# Patient Record
Sex: Male | Born: 1940 | Race: White | Hispanic: No | Marital: Single | Smoking: Never smoker
Health system: Southern US, Community
[De-identification: ages and names within clinical notes are randomized; demographics above are authoritative.]

## PROBLEM LIST (undated history)

## (undated) DIAGNOSIS — R194 Change in bowel habit: Secondary | ICD-10-CM

## (undated) DIAGNOSIS — R1310 Dysphagia, unspecified: Secondary | ICD-10-CM

## (undated) DIAGNOSIS — N200 Calculus of kidney: Secondary | ICD-10-CM

## (undated) DIAGNOSIS — K5900 Constipation, unspecified: Secondary | ICD-10-CM

## (undated) DIAGNOSIS — N189 Chronic kidney disease, unspecified: Secondary | ICD-10-CM

## (undated) DIAGNOSIS — C801 Malignant (primary) neoplasm, unspecified: Secondary | ICD-10-CM

## (undated) DIAGNOSIS — R634 Abnormal weight loss: Secondary | ICD-10-CM

## (undated) DIAGNOSIS — F4320 Adjustment disorder, unspecified: Secondary | ICD-10-CM

## (undated) DIAGNOSIS — R202 Paresthesia of skin: Secondary | ICD-10-CM

## (undated) HISTORY — DX: Calculus of kidney: N20.0

## (undated) HISTORY — PX: COLONOSCOPY: SHX174

## (undated) HISTORY — DX: Chronic kidney disease, unspecified: N18.9

## (undated) HISTORY — PX: TONSILLECTOMY: SUR1361

## (undated) HISTORY — PX: OTHER SURGICAL HISTORY: SHX169

---

## 2005-01-01 ENCOUNTER — Ambulatory Visit: Payer: Self-pay | Admitting: Unknown Physician Specialty

## 2007-01-06 ENCOUNTER — Ambulatory Visit: Payer: Self-pay | Admitting: Internal Medicine

## 2016-05-03 ENCOUNTER — Other Ambulatory Visit: Payer: Self-pay | Admitting: Internal Medicine

## 2016-05-03 DIAGNOSIS — K5900 Constipation, unspecified: Secondary | ICD-10-CM

## 2016-05-03 DIAGNOSIS — R634 Abnormal weight loss: Secondary | ICD-10-CM

## 2016-05-10 ENCOUNTER — Ambulatory Visit
Admission: RE | Admit: 2016-05-10 | Discharge: 2016-05-10 | Disposition: A | Discharge status: Home / Self Care | Payer: Medicare Other | Source: Ambulatory Visit | Attending: Internal Medicine | Admitting: Internal Medicine

## 2016-05-10 DIAGNOSIS — N400 Enlarged prostate without lower urinary tract symptoms: Secondary | ICD-10-CM | POA: Diagnosis not present

## 2016-05-10 DIAGNOSIS — K5900 Constipation, unspecified: Secondary | ICD-10-CM

## 2016-05-10 DIAGNOSIS — K769 Liver disease, unspecified: Secondary | ICD-10-CM | POA: Insufficient documentation

## 2016-05-10 DIAGNOSIS — R933 Abnormal findings on diagnostic imaging of other parts of digestive tract: Secondary | ICD-10-CM | POA: Diagnosis not present

## 2016-05-10 DIAGNOSIS — N289 Disorder of kidney and ureter, unspecified: Secondary | ICD-10-CM | POA: Diagnosis not present

## 2016-05-10 DIAGNOSIS — N132 Hydronephrosis with renal and ureteral calculous obstruction: Secondary | ICD-10-CM | POA: Insufficient documentation

## 2016-05-10 DIAGNOSIS — K5909 Other constipation: Secondary | ICD-10-CM | POA: Diagnosis present

## 2016-05-10 DIAGNOSIS — R634 Abnormal weight loss: Secondary | ICD-10-CM

## 2016-07-10 ENCOUNTER — Emergency Department
Admission: EM | Admit: 2016-07-10 | Discharge: 2016-07-10 | Disposition: A | Discharge status: Home / Self Care | Payer: Medicare Other | Attending: Emergency Medicine | Admitting: Emergency Medicine

## 2016-07-10 ENCOUNTER — Emergency Department: Payer: Medicare Other

## 2016-07-10 DIAGNOSIS — R1084 Generalized abdominal pain: Secondary | ICD-10-CM | POA: Diagnosis present

## 2016-07-10 DIAGNOSIS — K5900 Constipation, unspecified: Secondary | ICD-10-CM | POA: Insufficient documentation

## 2016-07-10 DIAGNOSIS — Z85828 Personal history of other malignant neoplasm of skin: Secondary | ICD-10-CM | POA: Diagnosis not present

## 2016-07-10 MED ORDER — POLYETHYLENE GLYCOL 3350 17 GM/SCOOP PO POWD: ORAL | 0 refills | Status: AC

## 2016-07-10 MED ORDER — MAGNESIUM CITRATE PO SOLN
ORAL | Status: AC
  Administered 2016-07-10: 1 via ORAL
  Filled 2016-07-10: qty 296

## 2016-07-10 MED ORDER — MAGNESIUM CITRATE PO SOLN
1.0000 | Freq: Once | ORAL | Status: AC
  Administered 2016-07-10: 1 via ORAL

## 2016-07-10 MED ORDER — SENNA 8.6 MG PO TABS: 2.0000 | ORAL_TABLET | Freq: Two times a day (BID) | ORAL | 0 refills | Status: AC

## 2016-07-10 MED ORDER — DOCUSATE SODIUM 100 MG PO CAPS
200.0000 mg | ORAL_CAPSULE | Freq: Two times a day (BID) | ORAL | 0 refills | Status: AC
Start: 2016-07-10 — End: ?

## 2016-07-10 NOTE — ED Provider Notes (Signed)
[facility] Emergency Department Provider Note  ____________________________________________  Time seen: Approximately 10:19 PM  I have reviewed the triage vital signs and the nursing notes.   HISTORY  Chief Complaint Constipation    HPI Thomas Beasley is a 76 y.o. male who complains of chronic constipation. He's been having small bowel movements for many months. Denies nausea or vomiting. Eating normally. Has occasional abdominal fullness, and the passes some stool and feels better. He is passing flatus. Takes small doses of MiraLAX, half capful, to manage this without relief. No focal pain. No fever or chills. No dizziness.     No past medical history on file. Enlarged prostate  There are no active problems to display for this patient.    No past surgical history on file. Tonsillectomy  Prior to Admission medications   Medication Sig Start Date End Date Taking? Authorizing Provider  docusate sodium (COLACE) 100 MG capsule Take 2 capsules (200 mg total) by mouth 2 (two) times daily. 07/10/16   Carrie Mew, MD  polyethylene glycol powder [facility]) powder 2 cap fulls in a full glass of water, two times a day, for 5 days. 07/10/16   Carrie Mew, MD  senna (SENOKOT) 8.6 MG TABS tablet Take 2 tablets (17.2 mg total) by mouth 2 (two) times daily. 07/10/16   Carrie Mew, MD     Allergies Patient has no known allergies.   No family history on file.  Social History Social History  Substance Use Topics  . Smoking status: Not on file  . Smokeless tobacco: Not on file  . Alcohol use Not on file  No tobacco or alcohol use  Review of Systems  Constitutional:   No fever or chills.  ENT:   No sore throat. No rhinorrhea. Cardiovascular:   No chest pain. Respiratory:   No dyspnea or cough. Gastrointestinal:   Occasional abdominal fullness, no vomiting. Chronic constipation. Genitourinary:   Negative for dysuria or  difficulty urinating.  10-point ROS otherwise negative.  ____________________________________________   PHYSICAL EXAM:  VITAL SIGNS: ED Triage Vitals [07/10/16 1923]  Enc Vitals Group     BP 139/86     Pulse Rate 74     Resp 20     Temp 97.8 F (36.6 C)     Temp Source Oral     SpO2 97 %     Weight 113 lb (51.3 kg)     Height 5\' 9"  (1.753 m)     Head Circumference      Peak Flow      Pain Score      Pain Loc      Pain Edu?      Excl. in [place]?     Vital signs reviewed, nursing assessments reviewed.   Constitutional:   Alert and oriented. Well appearing and in no distress. Eyes:   No scleral icterus. No conjunctival pallor. PERRL. EOMI.  No nystagmus. ENT   Head:   Normocephalic and atraumatic.   Nose:   No congestion/rhinnorhea. No septal hematoma   Mouth/Throat:   MMM, no pharyngeal erythema. No peritonsillar mass.    Neck:   No stridor. No SubQ emphysema. No meningismus. Hematological/Lymphatic/Immunilogical:   No cervical lymphadenopathy. Cardiovascular:   RRR. Symmetric bilateral radial and DP pulses.  No murmurs.  Respiratory:   Normal respiratory effort without tachypnea nor retractions. Breath sounds are clear and equal bilaterally. No wheezes/rales/rhonchi. Gastrointestinal:   Soft and nontender. Non distended. There is no CVA tenderness.  No rebound,  rigidity, or guarding.No tympany. Rectal exam shows small amount of brown stool in the vault, no fecal impaction or other hard stool present. Genitourinary:   Normal Musculoskeletal:   Normal range of motion in all extremities. No joint effusions.  No lower extremity tenderness.  No edema. Neurologic:   Normal speech and language.  CN 2-10 normal. Motor grossly intact. No gross focal neurologic deficits are appreciated.  Skin:    Skin is warm, dry and intact. No rash noted.  No petechiae, purpura, or bullae.  ____________________________________________    LABS (pertinent positives/negatives) (all  labs ordered are listed, but only abnormal results are displayed) Labs Reviewed - No data to display ____________________________________________   EKG    ____________________________________________    RADIOLOGY  Dg Abdomen 1 View  Result Date: 07/10/2016 CLINICAL DATA:  Constipation, possible obstruction. EXAM: ABDOMEN - 1 VIEW COMPARISON:  CT 05/10/2016 FINDINGS: Air stool throughout the colon with moderate fecal retention over the left colon and rectosigmoid colon. Few nondilated air-filled small bowel loops are present. No evidence of free peritoneal air. Degenerative change of the spine. IMPRESSION: Nonobstructive bowel gas pattern mild fecal retention over the rectosigmoid colon and left colon. Electronically Signed   By: Marin Olp M.D.   On: 07/10/2016 20:11    ____________________________________________   PROCEDURES Procedures  ____________________________________________   INITIAL IMPRESSION / ASSESSMENT AND PLAN / ED COURSE  Pertinent labs & imaging results that were available during my care of the patient were reviewed by me and considered in my medical decision making (see chart for details).  Patient presents with symptoms of chronic constipation. Very well-appearing. Tolerating oral intake. X-ray consistent with constipation. No fecal impaction on rectal exam, no hard retained stool. Plan to discharge home, short course of high-dose MiraLAX, magnesium citrate. Labs from yesterday entirely normal. Follow-up with primary care.Considering the patient's symptoms, medical history, and physical examination today, I have low suspicion for cholecystitis or biliary pathology, pancreatitis, perforation or bowel obstruction, hernia, intra-abdominal abscess, AAA or dissection, volvulus or intussusception, mesenteric ischemia, or appendicitis.       Clinical Course as of Jul 10 2217  Tue Jul 10, 2016  2152 Xr nonobstructive. Sx not c/w SBO/volvulus. Rectal negative.  Mag citrate, f/u pcp  [PS]    Clinical Course User Index [PS] Carrie Mew, MD     ____________________________________________   FINAL CLINICAL IMPRESSION(S) / ED DIAGNOSES  Final diagnoses:  Constipation, unspecified constipation type  Generalized abdominal pain      New Prescriptions   DOCUSATE SODIUM (COLACE) 100 MG CAPSULE    Take 2 capsules (200 mg total) by mouth 2 (two) times daily.   POLYETHYLENE GLYCOL POWDER (GLYCOLAX/MIRALAX) POWDER    2 cap fulls in a full glass of water, two times a day, for 5 days.   SENNA (SENOKOT) 8.6 MG TABS TABLET    Take 2 tablets (17.2 mg total) by mouth 2 (two) times daily.     Portions of this note were generated with dragon dictation software. Dictation errors may occur despite best attempts at proofreading.    Carrie Mew, MD 07/10/16 2225

## 2016-07-10 NOTE — ED Notes (Signed)
Pt states long hx w/constipation impactions. States daily oral meds, and if no BM, also uses a suppository and sometimes the only product is gas and mucous. Concerns for obstruction today d/t decreased urination as well.

## 2016-07-10 NOTE — ED Triage Notes (Signed)
Patient reports saw GI doctors to set up a colonoscopy and he informed them of long history of constipation/obstruction.  They instructed him to take 2 caps full of Miralax last night and again around noon today.  Patient had no results.  Patient was instructed to come ED for possible obstruction.

## 2016-07-10 NOTE — ED Notes (Signed)

## 2016-07-13 ENCOUNTER — Encounter: Payer: Self-pay | Admitting: *Deleted

## 2016-07-16 ENCOUNTER — Ambulatory Visit
Admission: RE | Admit: 2016-07-16 | Discharge: 2016-07-16 | Disposition: A | Discharge status: Home / Self Care | Payer: Medicare Other | Source: Ambulatory Visit | Attending: Unknown Physician Specialty | Admitting: Unknown Physician Specialty

## 2016-07-16 ENCOUNTER — Ambulatory Visit: Payer: Medicare Other | Admitting: Anesthesiology

## 2016-07-16 ENCOUNTER — Encounter: Payer: Self-pay | Admitting: *Deleted

## 2016-07-16 ENCOUNTER — Encounter
Admission: RE | Disposition: A | Discharge status: Home / Self Care | Payer: Self-pay | Source: Ambulatory Visit | Attending: Unknown Physician Specialty

## 2016-07-16 DIAGNOSIS — Z7982 Long term (current) use of aspirin: Secondary | ICD-10-CM | POA: Insufficient documentation

## 2016-07-16 DIAGNOSIS — R194 Change in bowel habit: Secondary | ICD-10-CM | POA: Diagnosis not present

## 2016-07-16 DIAGNOSIS — K648 Other hemorrhoids: Secondary | ICD-10-CM | POA: Insufficient documentation

## 2016-07-16 DIAGNOSIS — K222 Esophageal obstruction: Secondary | ICD-10-CM | POA: Diagnosis not present

## 2016-07-16 DIAGNOSIS — Z85828 Personal history of other malignant neoplasm of skin: Secondary | ICD-10-CM | POA: Insufficient documentation

## 2016-07-16 DIAGNOSIS — K219 Gastro-esophageal reflux disease without esophagitis: Secondary | ICD-10-CM | POA: Insufficient documentation

## 2016-07-16 HISTORY — DX: Paresthesia of skin: R20.2

## 2016-07-16 HISTORY — DX: Malignant (primary) neoplasm, unspecified: C80.1

## 2016-07-16 HISTORY — DX: Dysphagia, unspecified: R13.10

## 2016-07-16 HISTORY — DX: Change in bowel habit: R19.4

## 2016-07-16 HISTORY — DX: Abnormal weight loss: R63.4

## 2016-07-16 HISTORY — PX: ESOPHAGOGASTRODUODENOSCOPY (EGD) WITH PROPOFOL: SHX5813

## 2016-07-16 HISTORY — PX: COLONOSCOPY WITH PROPOFOL: SHX5780

## 2016-07-16 HISTORY — DX: Constipation, unspecified: K59.00

## 2016-07-16 HISTORY — DX: Adjustment disorder, unspecified: F43.20

## 2016-07-16 SURGERY — COLONOSCOPY WITH PROPOFOL
Anesthesia: General

## 2016-07-16 MED ORDER — SODIUM CHLORIDE 0.9 % IV SOLN: INTRAVENOUS | Status: DC

## 2016-07-16 MED ORDER — LIDOCAINE HCL (PF) 2 % IJ SOLN
INTRAMUSCULAR | Status: DC | PRN
  Administered 2016-07-16: 40 mg

## 2016-07-16 MED ORDER — PROPOFOL 500 MG/50ML IV EMUL
INTRAVENOUS | Status: DC | PRN
  Administered 2016-07-16: 50 ug/kg/min via INTRAVENOUS

## 2016-07-16 MED ORDER — SODIUM CHLORIDE 0.9 % IV SOLN
INTRAVENOUS | Status: DC
  Administered 2016-07-16: 14:00:00 via INTRAVENOUS

## 2016-07-16 MED ORDER — PROPOFOL 10 MG/ML IV BOLUS
INTRAVENOUS | Status: AC
  Filled 2016-07-16: qty 20

## 2016-07-16 MED ORDER — GLYCOPYRROLATE 0.2 MG/ML IJ SOLN
INTRAMUSCULAR | Status: AC
  Filled 2016-07-16: qty 1

## 2016-07-16 MED ORDER — PROPOFOL 10 MG/ML IV BOLUS
INTRAVENOUS | Status: DC | PRN
  Administered 2016-07-16 (×2): 20 mg via INTRAVENOUS
  Administered 2016-07-16: 10 mg via INTRAVENOUS
  Administered 2016-07-16: 20 mg via INTRAVENOUS

## 2016-07-16 MED ORDER — FENTANYL CITRATE (PF) 100 MCG/2ML IJ SOLN
INTRAMUSCULAR | Status: AC
  Filled 2016-07-16: qty 2

## 2016-07-16 MED ORDER — GLYCOPYRROLATE 0.2 MG/ML IJ SOLN
INTRAMUSCULAR | Status: DC | PRN
  Administered 2016-07-16: 0.1 mg via INTRAVENOUS

## 2016-07-16 MED ORDER — FENTANYL CITRATE (PF) 100 MCG/2ML IJ SOLN
INTRAMUSCULAR | Status: DC | PRN
  Administered 2016-07-16 (×4): 25 ug via INTRAVENOUS

## 2016-07-16 MED ORDER — PHENYLEPHRINE HCL 10 MG/ML IJ SOLN
INTRAMUSCULAR | Status: DC | PRN
  Administered 2016-07-16: 100 ug via INTRAVENOUS
  Administered 2016-07-16: 200 ug via INTRAVENOUS
  Administered 2016-07-16 (×6): 100 ug via INTRAVENOUS
  Administered 2016-07-16: 200 ug via INTRAVENOUS
  Administered 2016-07-16 (×2): 100 ug via INTRAVENOUS

## 2016-07-16 NOTE — Op Note (Addendum)
[facility] Gastroenterology Patient Name: Thomas Beasley Procedure Date: 07/16/2016 2:05 PM MRN: [id number] Account #: [id number] Date of Birth: 06/21/1940 Admit Type: Outpatient Age: 76 Room: [facility] ENDO ROOM 1 Gender: Male Note Status: Finalized Procedure:            Upper GI endoscopy Indications:          Weight loss Providers:            Manya Silvas, MD Referring MD:         Tracie Harrier, MD (Referring MD) Medicines:            Propofol per Anesthesia Complications:        No immediate complications. Procedure:            Pre-Anesthesia Assessment:                       - After reviewing the risks and benefits, the patient                        was deemed in satisfactory condition to undergo the                        procedure.                       After obtaining informed consent, the endoscope was                        passed under direct vision. Throughout the procedure,                        the patient's blood pressure, pulse, and oxygen                        saturations were monitored continuously. The Endoscope                        was introduced through the mouth, and advanced to the                        second part of duodenum. The upper GI endoscopy was                        accomplished without difficulty. The patient tolerated                        the procedure well. Findings:      The examined esophagus was normal except for a mild Schatzki ring. This       was dilated at the time of the second look endoscopy due to dysphagia.      The entire examined stomach was normal.      The examined duodenum was normal. Biopsies were taken with a cold       forceps for histology. Biopsies for histology were taken with a cold       forceps for evaluation of celiac disease. Impression:           - esophageal ring.                       - Normal stomach.                       -  Normal examined duodenum. Biopsied. Recommendation:        - Await pathology results. Manya Silvas, MD 07/16/2016 2:42:29 PM This report has been signed electronically. Number of Addenda: 0 Note Initiated On: 07/16/2016 2:05 PM      [facility]

## 2016-07-16 NOTE — Op Note (Signed)
[facility] Gastroenterology Patient Name: Thomas Beasley Procedure Date: 07/16/2016 2:05 PM MRN: [id number] Account #: [id number] Date of Birth: 04/21/1940 Admit Type: Outpatient Age: 76 Room: [facility] ENDO ROOM 1 Gender: Male Note Status: Finalized Procedure:            Colonoscopy Indications:          Change in bowel habits, Weight loss Providers:            Manya Silvas, MD Medicines:            Propofol per Anesthesia Complications:        No immediate complications. Procedure:            Pre-Anesthesia Assessment:                       - After reviewing the risks and benefits, the patient                        was deemed in satisfactory condition to undergo the                        procedure.                       After obtaining informed consent, the colonoscope was                        passed under direct vision. Throughout the procedure,                        the patient's blood pressure, pulse, and oxygen                        saturations were monitored continuously. The                        Colonoscope was introduced through the anus and                        advanced to the the cecum, identified by appendiceal                        orifice and ileocecal valve. The colonoscopy was                        performed with difficulty due to significant looping                        and a tortuous colon. Successful completion of the                        procedure was aided by applying abdominal pressure. The                        patient tolerated the procedure well. The quality of                        the bowel preparation was excellent. Findings:      Internal hemorrhoids were found during endoscopy. The hemorrhoids were       small.      The colon was extremely long  and redundant requiring much pressure and       changes in position to pass the scope. Impression:           - Internal hemorrhoids.                       - No specimens  collected. Recommendation:       - The findings and recommendations were discussed with                        the patient's family. Need to consume more calories. Manya Silvas, MD 07/16/2016 3:19:40 PM This report has been signed electronically. Number of Addenda: 0 Note Initiated On: 07/16/2016 2:05 PM Scope Withdrawal Time: 0 hours 3 minutes 28 seconds  Total Procedure Duration: 0 hours 29 minutes 29 seconds       [facility]

## 2016-07-16 NOTE — Anesthesia Postprocedure Evaluation (Signed)
Anesthesia Post Note  Patient: Thomas Beasley [facility]  Procedure(s) Performed: Procedure(s) (LRB): COLONOSCOPY WITH PROPOFOL (N/A) ESOPHAGOGASTRODUODENOSCOPY (EGD) WITH PROPOFOL (N/A)  Patient location during evaluation: Endoscopy Anesthesia Type: General Level of consciousness: awake and alert and oriented Pain management: pain level controlled Vital Signs Assessment: post-procedure vital signs reviewed and stable Respiratory status: spontaneous breathing, nonlabored ventilation and respiratory function stable Cardiovascular status: blood pressure returned to baseline and stable Postop Assessment: no signs of nausea or vomiting Anesthetic complications: no     Last Vitals:  Vitals:   07/16/16 1540 07/16/16 1550  BP: 123/73 128/79  Pulse: 67 61  Resp: 16 17  Temp:      Last Pain:  Vitals:   07/16/16 1530  TempSrc: Tympanic                 [name]

## 2016-07-16 NOTE — Anesthesia Preprocedure Evaluation (Addendum)
Anesthesia Evaluation  Patient identified by MRN, date of birth, ID band Patient awake    Reviewed: Allergy & Precautions, H&P , NPO status , Patient's Chart, lab work & pertinent test results  History of Anesthesia Complications Negative for: history of anesthetic complications  Airway Mallampati: III  TM Distance: <3 FB Neck ROM: limited    Dental  (+) Poor Dentition, Chipped, Missing   Pulmonary neg pulmonary ROS, neg shortness of breath,    Pulmonary exam normal breath sounds clear to auscultation       Cardiovascular Exercise Tolerance: Good (-) angina(-) Past MI and (-) DOE negative cardio ROS Normal cardiovascular exam Rhythm:regular Rate:Normal     Neuro/Psych PSYCHIATRIC DISORDERS negative neurological ROS     GI/Hepatic negative GI ROS, Neg liver ROS, neg GERD  ,  Endo/Other  negative endocrine ROS  Renal/GU negative Renal ROS  negative genitourinary   Musculoskeletal   Abdominal   Peds  Hematology negative hematology ROS (+)   Anesthesia Other Findings Past Medical History: No date: Abnormal weight loss No date: 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 No date: Change in bowel habits No date: Constipation No date: Dysphagia No date: Paresthesia of both feet No date: Situational disturbance  Past Surgical History: No date: COLONOSCOPY No date: SKIN CANCER EXCISED No date: TONSILLECTOMY     Reproductive/Obstetrics negative OB ROS                             Anesthesia Physical Anesthesia Plan  ASA: III  Anesthesia Plan: General   Post-op Pain Management:    Induction: Intravenous  Airway Management Planned: Natural Airway  Additional Equipment:   Intra-op Plan:   Post-operative Plan:   Informed Consent: I have reviewed the patients History and Physical, chart, labs and discussed the procedure including the risks, benefits and alternatives for the proposed  anesthesia with the patient or authorized representative who has indicated his/her understanding and acceptance.   Dental Advisory Given  Plan Discussed with: Anesthesiologist, CRNA and Surgeon  Anesthesia Plan Comments: (Patient consented for risks of anesthesia including but not limited to:  - adverse reactions to medications - damage to teeth, lips or other oral mucosa - sore throat or hoarseness - Damage to heart, brain, lungs or loss of life  Patient voiced understanding.)       Anesthesia Quick Evaluation

## 2016-07-16 NOTE — Anesthesia Post-op Follow-up Note (Cosign Needed)
Anesthesia QCDR form completed.        

## 2016-07-16 NOTE — H&P (Signed)
   Primary Care Physician:  Tracie Harrier, MD Primary Gastroenterologist:  Dr. Vira Agar  Pre-Procedure History & Physical: HPI:  Thomas Beasley is a 76 y.o. male is here for an endoscopy and colonoscopy.   Past Medical History:  Diagnosis Date  . Abnormal weight loss   . Cancer ([place])    SKIN  . Change in bowel habits   . Constipation   . Dysphagia   . Paresthesia of both feet   . Situational disturbance     Past Surgical History:  Procedure Laterality Date  . COLONOSCOPY    . SKIN CANCER EXCISED    . TONSILLECTOMY      Prior to Admission medications   Medication Sig Start Date End Date Taking? Authorizing Provider  aspirin EC 325 MG tablet Take 325 mg by mouth daily.   Yes Historical Provider, MD  docusate sodium (COLACE) 100 MG capsule Take 2 capsules (200 mg total) by mouth 2 (two) times daily. 07/10/16  Yes Carrie Mew, MD  polyethylene glycol powder [facility]) powder 2 cap fulls in a full glass of water, two times a day, for 5 days. 07/10/16  Yes Carrie Mew, MD  senna (SENOKOT) 8.6 MG TABS tablet Take 2 tablets (17.2 mg total) by mouth 2 (two) times daily. 07/10/16  Yes Carrie Mew, MD  vitamin B-12 (CYANOCOBALAMIN) 500 MCG tablet Take by mouth daily.   Yes Historical Provider, MD    Allergies as of 07/11/2016  . (No Known Allergies)    History reviewed. No pertinent family history.  Social History   Social History  . Marital status: Single    Spouse name: N/A  . Number of children: N/A  . Years of education: N/A   Occupational History  . Not on file.   Social History Main Topics  . Smoking status: Never Smoker  . Smokeless tobacco: Never Used  . Alcohol use Yes     Comment: occasional wine  . Drug use: No  . Sexual activity: Not on file   Other Topics Concern  . Not on file   Social History Narrative  . No narrative on file    Review of Systems: See HPI, otherwise negative ROS  Physical Exam: BP (!) 101/57 (BP  Location: Right Arm)   Pulse 97   Temp 97.2 F (36.2 C) (Tympanic)   Resp 17 Comment: Simultaneous filing. User may not have seen previous data.  Ht 5\' 9"  (1.753 m)   Wt 51.3 kg (113 lb)   SpO2 100% Comment: Simultaneous filing. User may not have seen previous data.  BMI 16.69 kg/m  General:   Alert,  pleasant and cooperative in NAD Head:  Normocephalic and atraumatic. Neck:  Supple; no masses or thyromegaly. Lungs:  Clear throughout to auscultation.    Heart:  Regular rate and rhythm. Abdomen:  Soft, nontender and nondistended. Normal bowel sounds, without guarding, and without rebound.   Neurologic:  Alert and  oriented x4;  grossly normal neurologically.  Impression/Plan: Thomas Beasley is here for an endoscopy and colonoscopy to be performed for weight loss constipation, dysphagia  Risks, benefits, limitations, and alternatives regarding  endoscopy and colonoscopy have been reviewed with the patient.  Questions have been answered.  All parties agreeable.   Gaylyn Cheers, MD  07/16/2016, 3:33 PM

## 2016-07-16 NOTE — Transfer of Care (Signed)
Immediate Anesthesia Transfer of Care Note  Patient: Thomas Beasley  Procedure(s) Performed: Procedure(s): COLONOSCOPY WITH PROPOFOL (N/A) ESOPHAGOGASTRODUODENOSCOPY (EGD) WITH PROPOFOL (N/A)  Patient Location: Endoscopy Unit  Anesthesia Type:General  Level of Consciousness: awake and alert   Airway & Oxygen Therapy: Patient connected to nasal cannula oxygen  Post-op Assessment: Post -op Vital signs reviewed and stable  Post vital signs: stable  Last Vitals:  Vitals:   07/16/16 1402 07/16/16 1530  BP: 127/80 (!) 101/57  Pulse: 94 (P) 97  Resp: 20 17  Temp: 36.4 C 36.2 C    Last Pain:  Vitals:   07/16/16 1530  TempSrc: Tympanic         Complications: No apparent anesthesia complications

## 2016-07-17 ENCOUNTER — Encounter: Payer: Self-pay | Admitting: Unknown Physician Specialty

## 2016-07-18 LAB — SURGICAL PATHOLOGY

## 2017-04-29 ENCOUNTER — Encounter: Payer: Self-pay | Admitting: Urology

## 2017-04-29 ENCOUNTER — Ambulatory Visit (INDEPENDENT_AMBULATORY_CARE_PROVIDER_SITE_OTHER): Payer: Medicare Other | Admitting: Urology

## 2017-04-29 VITALS — BP 125/83 | HR 76 | Ht 69.0 in | Wt 122.0 lb

## 2017-04-29 DIAGNOSIS — R3129 Other microscopic hematuria: Secondary | ICD-10-CM | POA: Diagnosis not present

## 2017-04-29 LAB — MICROSCOPIC EXAMINATION
BACTERIA UA: NONE SEEN
EPITHELIAL CELLS (NON RENAL): NONE SEEN /HPF (ref 0–10)
WBC, UA: NONE SEEN /hpf (ref 0–?)

## 2017-04-29 LAB — URINALYSIS, COMPLETE
BILIRUBIN UA: NEGATIVE
Glucose, UA: NEGATIVE
Leukocytes, UA: NEGATIVE
Nitrite, UA: NEGATIVE
PH UA: 7 (ref 5.0–7.5)
RBC, UA: NEGATIVE
Specific Gravity, UA: 1.02 (ref 1.005–1.030)
UUROB: 1 mg/dL (ref 0.2–1.0)

## 2017-04-29 NOTE — Progress Notes (Signed)
04/29/2017 1:41 PM   Thomas Beasley [facility] June 14, 1940 [id number]  Referring provider: Tracie Harrier, [place] [facility] [place], [place] [address]  Chief Complaint  Patient presents with  . New Patient (Initial Visit)    HPI: The patient was referred for blood in the urine and blood in the sperm.  For at least 6 months he has had blood with ejaculation that comes and goes.  Sometimes he may have some small clots or coagulation associated with it.  Sometimes it is brown in color.  He does not complain of a lot of pain with ejaculation.  He takes daily baby aspirin but no other blood thinners and he has never smoked  He voids every 1 hour and gets up 5-6 times at night.  He wears a pad during the day for near daily urgency incontinence.  He changes tissue a few times throughout the day with a double pad system.  He voids with a good flow.  He has no neurologic issues.  He has trouble with bowel movements.  He reports a low and normal PSA done annually by primary care but I could not find the value today  Modifying factors: There are no other modifying factors  Associated signs and symptoms: There are no other associated signs and symptoms Aggravating and relieving factors: There are no other aggravating or relieving factors Severity: Moderate Duration: Persistent   PMH: Past Medical History:  Diagnosis Date  . Abnormal weight loss   . Cancer ([place])    SKIN  . Change in bowel habits   . Chronic kidney disease   . Constipation   . Dysphagia   . Kidney stone   . Paresthesia of both feet   . Situational disturbance     Surgical History: Past Surgical History:  Procedure Laterality Date  . COLONOSCOPY    . COLONOSCOPY WITH PROPOFOL N/A 07/16/2016   Procedure: COLONOSCOPY WITH PROPOFOL;  Surgeon: Manya Silvas, MD;  Location: [facility] ENDOSCOPY;  Service: Endoscopy;  Laterality: N/A;  . ESOPHAGOGASTRODUODENOSCOPY (EGD) WITH PROPOFOL N/A 07/16/2016     Procedure: ESOPHAGOGASTRODUODENOSCOPY (EGD) WITH PROPOFOL;  Surgeon: Manya Silvas, MD;  Location: [facility] ENDOSCOPY;  Service: Endoscopy;  Laterality: N/A;  . SKIN CANCER EXCISED    . TONSILLECTOMY      Home Medications:  Allergies as of 04/29/2017   No Known Allergies     Medication List        Accurate as of 04/29/17  1:41 PM. Always use your most recent med list.          aspirin EC 325 MG tablet Take 325 mg by mouth daily.   docusate sodium 100 MG capsule Commonly known as:  COLACE Take 2 capsules (200 mg total) by mouth 2 (two) times daily.   polyethylene glycol powder powder Commonly known as:  GLYCOLAX/MIRALAX 2 cap fulls in a full glass of water, two times a day, for 5 days.   senna 8.6 MG Tabs tablet Commonly known as:  SENOKOT Take 2 tablets (17.2 mg total) by mouth 2 (two) times daily.   vitamin B-12 500 MCG tablet Commonly known as:  CYANOCOBALAMIN Take by mouth daily.       Allergies: No Known Allergies  Family History: Family History  Problem Relation Age of Onset  . Kidney cancer Father   . Kidney disease Neg Hx   . Prostate cancer Neg Hx     Social History:  reports that  has never smoked.  he has never used smokeless tobacco. He reports that he drinks alcohol. He reports that he does not use drugs.  ROS: UROLOGY Frequent Urination?: Yes Hard to postpone urination?: Yes Burning/pain with urination?: No Get up at night to urinate?: Yes Leakage of urine?: Yes Urine stream starts and stops?: Yes Trouble starting stream?: No Do you have to strain to urinate?: No Blood in urine?: No Urinary tract infection?: No Sexually transmitted disease?: No Injury to kidneys or bladder?: No Painful intercourse?: No Weak stream?: No Erection problems?: No Penile pain?: No  Gastrointestinal Nausea?: No Vomiting?: No Indigestion/heartburn?: No Diarrhea?: No Constipation?: Yes  Constitutional Fever: No Night sweats?: No Weight loss?:  Yes Fatigue?: Yes  Skin Skin rash/lesions?: No Itching?: No  Eyes Blurred vision?: No Double vision?: No  Ears/Nose/Throat Sore throat?: No Sinus problems?: No  Hematologic/Lymphatic Swollen glands?: No Easy bruising?: Yes  Cardiovascular Leg swelling?: No Chest pain?: No  Respiratory Cough?: No Shortness of breath?: No  Endocrine Excessive thirst?: No  Musculoskeletal Back pain?: No Joint pain?: No  Neurological Headaches?: No Dizziness?: No  Psychologic Depression?: No Anxiety?: No  Physical Exam: BP 125/83   Pulse 76   Ht 5\' 9"  (1.753 m)   Wt 122 lb (55.3 kg)   BMI 18.02 kg/m   Constitutional:  Alert and oriented, No acute distress. HEENT: [place] AT, moist mucus membranes.  Trachea midline, no masses. Cardiovascular: No clubbing, cyanosis, or edema. Respiratory: Normal respiratory effort, no increased work of breathing. GI: Abdomen is soft, nontender, nondistended, no abdominal masses GU: 40 g benign prostate Skin: No rashes, bruises or suspicious lesions. Lymph: No cervical or inguinal adenopathy. Neurologic: Grossly intact, no focal deficits, moving all 4 extremities. Psychiatric: Normal mood and affect.  Laboratory Data:  Urinalysis No results found for: COLORURINE, APPEARANCEUR, LABSPEC, [place], GLUCOSEU, HGBUR, BILIRUBINUR, KETONESUR, PROTEINUR, UROBILINOGEN, NITRITE, LEUKOCYTESUR  Pertinent Imaging: No recent x-ray  Assessment & Plan: The patient did not have microscopic hematuria today but I felt that he should have the complete evaluation which would also provide cystoscopy for recurrent blood with ejaculation.  The patient will return for cystoscopy and with a hematuria CT scan.  He had a normal noncontrast CT scan 1 year ago.  Differential diagnosis and workup described.  I had to politely counseled the patient that I wanted to hear his answers to my questions as he was repeatedly entering by asking me to read the medical sheet that he  had filled out for his visit.  I educated him that although this information is useful I want to hear from the patient myself.  At times he appeared to be quite adamant to continue to provide such answers.  I think the patient would like his urgent bladder to be addressed and pathophysiology was discussed today.  Alpha blockers the beta 3 agonist and other OAB agents would be prudent to consider.  1. Microscopic hematuria  - Urinalysis, Complete   No Follow-up on file.  Reece Packer, MD  [facility] [address], [place],  [address] [phone]

## 2017-04-30 LAB — BUN+CREAT
BUN / CREAT RATIO: 23 (ref 10–24)
BUN: 17 mg/dL (ref 8–27)
Creatinine, Ser: 0.73 mg/dL — ABNORMAL LOW (ref 0.76–1.27)
GFR calc Af Amer: 103 mL/min/{1.73_m2} (ref >59)
GFR calc non Af Amer: 89 mL/min/{1.73_m2} (ref >59)

## 2017-05-03 LAB — CULTURE, URINE COMPREHENSIVE

## 2017-05-08 ENCOUNTER — Telehealth: Payer: Self-pay | Admitting: Family Medicine

## 2017-05-08 MED ORDER — NITROFURANTOIN MACROCRYSTAL 100 MG PO CAPS: 100.0000 mg | ORAL_CAPSULE | Freq: Two times a day (BID) | ORAL | 0 refills | Status: DC

## 2017-05-08 NOTE — Telephone Encounter (Signed)
LMOM for patient to pick up ABX for UTI

## 2017-05-08 NOTE — Telephone Encounter (Signed)
-----   Message from Bjorn Loser, MD sent at 05/06/2017  8:33 AM EST ----- Macrodantin 100 mg bid for 7 days    ----- Message ----- From: Kyra Manges, CMA Sent: 05/02/2017   9:11 AM To: Bjorn Loser, MD    ----- Message ----- From: Interface, [facility] Lab Results In Sent: 04/29/2017   4:37 PM To: Rowe Robert Clinical

## 2017-05-15 ENCOUNTER — Ambulatory Visit
Admission: RE | Admit: 2017-05-15 | Discharge: 2017-05-15 | Disposition: A | Discharge status: Home / Self Care | Payer: Medicare Other | Source: Ambulatory Visit | Attending: Urology | Admitting: Urology

## 2017-05-15 DIAGNOSIS — N200 Calculus of kidney: Secondary | ICD-10-CM | POA: Insufficient documentation

## 2017-05-15 DIAGNOSIS — R3129 Other microscopic hematuria: Secondary | ICD-10-CM | POA: Insufficient documentation

## 2017-05-15 DIAGNOSIS — I700 Atherosclerosis of aorta: Secondary | ICD-10-CM | POA: Diagnosis not present

## 2017-05-15 DIAGNOSIS — N400 Enlarged prostate without lower urinary tract symptoms: Secondary | ICD-10-CM | POA: Diagnosis not present

## 2017-05-15 DIAGNOSIS — K869 Disease of pancreas, unspecified: Secondary | ICD-10-CM | POA: Diagnosis not present

## 2017-05-15 DIAGNOSIS — N323 Diverticulum of bladder: Secondary | ICD-10-CM | POA: Diagnosis not present

## 2017-05-15 MED ORDER — IOPAMIDOL (ISOVUE-300) INJECTION 61%
100.0000 mL | Freq: Once | INTRAVENOUS | Status: AC | PRN
  Administered 2017-05-15: 100 mL via INTRAVENOUS

## 2017-05-20 ENCOUNTER — Encounter: Payer: Self-pay | Admitting: Urology

## 2017-05-20 ENCOUNTER — Ambulatory Visit (INDEPENDENT_AMBULATORY_CARE_PROVIDER_SITE_OTHER): Payer: Medicare Other | Admitting: Urology

## 2017-05-20 VITALS — BP 160/92 | HR 72 | Ht 69.0 in | Wt 130.1 lb

## 2017-05-20 DIAGNOSIS — R3129 Other microscopic hematuria: Secondary | ICD-10-CM

## 2017-05-20 LAB — URINALYSIS, COMPLETE
Bilirubin, UA: NEGATIVE
GLUCOSE, UA: NEGATIVE
LEUKOCYTES UA: NEGATIVE
NITRITE UA: NEGATIVE
Protein, UA: NEGATIVE
RBC, UA: NEGATIVE
Specific Gravity, UA: 1.02 (ref 1.005–1.030)
Urobilinogen, Ur: 1 mg/dL (ref 0.2–1.0)
pH, UA: 6.5 (ref 5.0–7.5)

## 2017-05-20 MED ORDER — CIPROFLOXACIN HCL 500 MG PO TABS
500.0000 mg | ORAL_TABLET | Freq: Once | ORAL | Status: AC
  Administered 2017-05-20: 500 mg via ORAL

## 2017-05-20 MED ORDER — CIPROFLOXACIN HCL 500 MG PO TABS: 500.0000 mg | ORAL_TABLET | Freq: Two times a day (BID) | ORAL | 0 refills | Status: DC

## 2017-05-20 MED ORDER — LIDOCAINE HCL 2 % EX GEL
1.0000 "application " | Freq: Once | CUTANEOUS | Status: AC
  Administered 2017-05-20: 1 via URETHRAL

## 2017-05-20 NOTE — Progress Notes (Signed)
05/20/2017 9:25 AM   Thomas Beasley [birth year] [id number]  Referring provider: Tracie Harrier, [place] [facility] [place], [place] [address]  Chief Complaint  Patient presents with  . Cysto    HPI: The patient was referred for blood in the urine and blood in the sperm.  For at least 6 months he has had blood with ejaculation that comes and goes.  Sometimes he may have some small clots or coagulation associated with it.  Sometimes it is brown in color.  He does not complain of a lot of pain with ejaculation.  He takes daily baby aspirin but no other blood thinners and he has never smoked  He voids every 1 hour and gets up 5-6 times at night.  He wears a pad during the day for near daily urgency incontinence.  He changes tissue a few times throughout the day with a double pad system.  He voids with a good flow.  The patient did not have microscopic hematuria today but I felt that he should have the complete evaluation which would also provide cystoscopy for recurrent blood with ejaculation.    I had to politely counseled the patient that I wanted to hear his answers to my questions as he was repeatedly entering by asking me to read the medical sheet that he had filled out for his visit.  I educated him that although this information is useful I want to hear from the patient myself.  At times he appeared to be quite adamant to continue to provide such answers.  I think the patient would like his urgent bladder to be addressed and pathophysiology was discussed today.  Alpha blockers the beta 3 agonist and other OAB agents would be prudent to consider.  Today CT scan demonstrated fullness bilaterally of the renal pelvis but no frank hydronephrosis.  I reviewed the x-rays in detail.  He had small 1-2 mm stones in both kidneys nonobstructing  Frequency is stable.  No more blood in urine.  He has not had any ejaculations recently.  Sometimes he can have a  little bit of discomfort with ejaculation and a little bit of testicular discomfort a few days later  After written and verbal consent patient underwent cystoscopy.  The penile and bulbar urethra normal.  He had bilobar enlargement of the prostate.  I did not see a lot of friable blood vessels.  He had a little bit of a high riding bladder neck.  He had a trabeculated bladder with small saccules.  I looked throughout the bladder in detail.  There is no cystitis.  There is no carcinoma.  Trigone was normal     PMH: Past Medical History:  Diagnosis Date  . Abnormal weight loss   . Cancer ([place])    SKIN  . Change in bowel habits   . Chronic kidney disease   . Constipation   . Dysphagia   . Kidney stone   . Paresthesia of both feet   . Situational disturbance     Surgical History: Past Surgical History:  Procedure Laterality Date  . COLONOSCOPY    . COLONOSCOPY WITH PROPOFOL N/A 07/16/2016   Procedure: COLONOSCOPY WITH PROPOFOL;  Surgeon: Manya Silvas, MD;  Location: [facility] ENDOSCOPY;  Service: Endoscopy;  Laterality: N/A;  . ESOPHAGOGASTRODUODENOSCOPY (EGD) WITH PROPOFOL N/A 07/16/2016   Procedure: ESOPHAGOGASTRODUODENOSCOPY (EGD) WITH PROPOFOL;  Surgeon: Manya Silvas, MD;  Location: [facility] ENDOSCOPY;  Service: Endoscopy;  Laterality: N/A;  . SKIN  CANCER EXCISED    . TONSILLECTOMY      Home Medications:  Allergies as of 05/20/2017   No Known Allergies     Medication List        Accurate as of 05/20/17  9:25 AM. Always use your most recent med list.          aspirin EC 325 MG tablet Take 325 mg by mouth daily.   docusate sodium 100 MG capsule Commonly known as:  COLACE Take 2 capsules (200 mg total) by mouth 2 (two) times daily.   polyethylene glycol powder powder Commonly known as:  GLYCOLAX/MIRALAX 2 cap fulls in a full glass of water, two times a day, for 5 days.   senna 8.6 MG Tabs tablet Commonly known as:  SENOKOT Take 2 tablets (17.2 mg total) by mouth 2  (two) times daily.   vitamin B-12 500 MCG tablet Commonly known as:  CYANOCOBALAMIN Take by mouth daily.       Allergies: No Known Allergies  Family History: Family History  Problem Relation Age of Onset  . Kidney cancer Father   . Kidney disease Neg Hx   . Prostate cancer Neg Hx     Social History:  reports that  has never smoked. he has never used smokeless tobacco. He reports that he drinks alcohol. He reports that he does not use drugs.  ROS:                                        Physical Exam: BP (!) 160/92 (BP Location: Right Arm, Patient Position: Sitting, Cuff Size: Normal)   Pulse 72   Ht 5\' 9"  (1.753 m)   Wt 130 lb 1.6 oz (59 kg)   BMI 19.21 kg/m   Constitutional:  Alert and oriented, No acute distress.   Laboratory Data: No results found for: WBC, HGB, HCT, MCV, PLT  Lab Results  Component Value Date   CREATININE 0.73 (L) 04/29/2017     Urinalysis    Component Value Date/Time   APPEARANCEUR Clear 04/29/2017 1256   GLUCOSEU Negative 04/29/2017 1256   BILIRUBINUR Negative 04/29/2017 1256   PROTEINUR Trace (A) 04/29/2017 1256   NITRITE Negative 04/29/2017 1256   LEUKOCYTESUR Negative 04/29/2017 1256    Pertinent Imaging:   Assessment & Plan: The patient has been cleared for blood in sperm and urine.  I thought it was best to give him a course of antibiotics for prostatitis recognizing potential limited value.  Theoretically this might even help some of his urgency.  Difficulty in treating blood in sperm discussed.  Reevaluate in approximately 5 or 6 weeks.  Ciprofloxacin 500 mg twice a day for 28 days given.  She was well aware of the pancreas cyst noted.  1. Microscopic hematuria 2.  Blood and ejaculation 3.  Urgency and frequency  - Urinalysis, Complete - ciprofloxacin (CIPRO) tablet 500 mg - lidocaine (XYLOCAINE) 2 % jelly 1 application   No Follow-up on file.  Reece Packer, MD  [facility] [address] [place], [place] [address] [phone]

## 2017-05-31 ENCOUNTER — Telehealth: Payer: Self-pay | Admitting: Urology

## 2017-05-31 NOTE — Telephone Encounter (Signed)
Patient was supposed to be given Cipro for 28 days but the script was only called in for 14 days. If so then he is out and needs more called in to his pharmacy. Please call and let the patient know.   Sharyn Lull

## 2017-06-03 MED ORDER — CIPROFLOXACIN HCL 500 MG PO TABS: 500.0000 mg | ORAL_TABLET | Freq: Two times a day (BID) | ORAL | 0 refills | Status: DC

## 2017-06-03 NOTE — Addendum Note (Signed)
Addended by: Garnette Gunner on: 06/03/2017 04:58 PM   Modules accepted: Orders

## 2017-06-03 NOTE — Telephone Encounter (Signed)
Previous rx sent in was for 7 days. I spoke w/ pt and informed him that I sent in the remaining dosage for his 28 day abx.   Rx sent into to pharmacy.   Cristie Hem, CMA

## 2017-07-01 ENCOUNTER — Ambulatory Visit: Payer: Medicare Other

## 2017-07-15 ENCOUNTER — Ambulatory Visit (INDEPENDENT_AMBULATORY_CARE_PROVIDER_SITE_OTHER): Payer: Medicare Other | Admitting: Urology

## 2017-07-15 ENCOUNTER — Encounter: Payer: Self-pay | Admitting: Urology

## 2017-07-15 VITALS — BP 120/74 | HR 74 | Ht 69.0 in | Wt 125.0 lb

## 2017-07-15 DIAGNOSIS — R3129 Other microscopic hematuria: Secondary | ICD-10-CM

## 2017-07-15 LAB — URINALYSIS, COMPLETE
BILIRUBIN UA: NEGATIVE
GLUCOSE, UA: NEGATIVE
KETONES UA: NEGATIVE
Leukocytes, UA: NEGATIVE
Nitrite, UA: NEGATIVE
PROTEIN UA: NEGATIVE
RBC UA: NEGATIVE
SPEC GRAV UA: 1.02 (ref 1.005–1.030)
UUROB: 0.2 mg/dL (ref 0.2–1.0)
pH, UA: 7 (ref 5.0–7.5)

## 2017-07-15 MED ORDER — MIRABEGRON ER 50 MG PO TB24: 50.0000 mg | ORAL_TABLET | Freq: Every day | ORAL | 11 refills | Status: DC

## 2017-07-15 NOTE — Progress Notes (Signed)
07/15/2017 1:41 PM   Jena Gauss [facility] Sep 13, 1940 [id number]  Referring provider: Tracie Harrier, [place] [facility] [place], [place] [address]  Chief Complaint  Patient presents with  . Hematuria    6wk    HPI: The patient was referred for blood in the urine and blood in the sperm. For at least 6 months he has had blood with ejaculation that comes and goes. Sometimes he may have some small clots or coagulation associated with it. Sometimes it is brown in color. He does not complain of a lot of pain with ejaculation.  He takes daily baby aspirin but no other blood thinners and he has never smoked  He voids every 1 hour and gets up 5-6 times at night. He wears a pad during the day for near daily urgency incontinence. He changes tissue a few times throughout the day with a double pad system. He voids with a good flow.  The patient did not have microscopic hematuria today but I felt that he should have the complete evaluation which would also provide cystoscopy for recurrent blood with ejaculation.   Today CT scan and cystoscopy within normal limits other than 1-2 mm stones in both kidneys nonobstructing.  Patient was cleared for blood in sperm and urine.  I thought it was best to give him a course of antibiotics for prostatitis recognizing potential limited value.  Theoretically this might even help some of his urgency.  Difficulty in treating blood in sperm discussed.  Reevaluate in approximately 5 or 6 weeks.  Ciprofloxacin 500 mg twice a day for 28 days given. Alpha blockers the beta 3 agonist and other OAB agents would be prudent to consider.  Blood in sperm no longer present.  Urgency and frequency still present.  Clinically not infected        PMH: Past Medical History:  Diagnosis Date  . Abnormal weight loss   . Cancer ([place])    SKIN  . Change in bowel habits   . Chronic kidney disease   . Constipation   . Dysphagia   .  Kidney stone   . Paresthesia of both feet   . Situational disturbance     Surgical History: Past Surgical History:  Procedure Laterality Date  . COLONOSCOPY    . COLONOSCOPY WITH PROPOFOL N/A 07/16/2016   Procedure: COLONOSCOPY WITH PROPOFOL;  Surgeon: Manya Silvas, MD;  Location: [facility] ENDOSCOPY;  Service: Endoscopy;  Laterality: N/A;  . ESOPHAGOGASTRODUODENOSCOPY (EGD) WITH PROPOFOL N/A 07/16/2016   Procedure: ESOPHAGOGASTRODUODENOSCOPY (EGD) WITH PROPOFOL;  Surgeon: Manya Silvas, MD;  Location: [facility] ENDOSCOPY;  Service: Endoscopy;  Laterality: N/A;  . SKIN CANCER EXCISED    . TONSILLECTOMY      Home Medications:  Allergies as of 07/15/2017   No Known Allergies     Medication List        Accurate as of 07/15/17  1:41 PM. Always use your most recent med list.          aspirin EC 325 MG tablet Take 325 mg by mouth daily.   ciprofloxacin 500 MG tablet Commonly known as:  CIPRO Take 1 tablet (500 mg total) by mouth every 12 (twelve) hours.   ciprofloxacin 500 MG tablet Commonly known as:  CIPRO Take 1 tablet (500 mg total) by mouth 2 (two) times daily.   docusate sodium 100 MG capsule Commonly known as:  COLACE Take 2 capsules (200 mg total) by mouth 2 (two) times daily.  polyethylene glycol powder powder Commonly known as:  GLYCOLAX/MIRALAX 2 cap fulls in a full glass of water, two times a day, for 5 days.   senna 8.6 MG Tabs tablet Commonly known as:  SENOKOT Take 2 tablets (17.2 mg total) by mouth 2 (two) times daily.   vitamin B-12 500 MCG tablet Commonly known as:  CYANOCOBALAMIN Take by mouth daily.       Allergies: No Known Allergies  Family History: Family History  Problem Relation Age of Onset  . Kidney cancer Father   . Kidney disease Neg Hx   . Prostate cancer Neg Hx     Social History:  reports that he has never smoked. He has never used smokeless tobacco. He reports that he drinks alcohol. He reports that he does not use  drugs.  ROS:                                        Physical Exam: There were no vitals taken for this visit.  Constitutional:  Alert and oriented, No acute distress.   Laboratory Data: No results found for: WBC, HGB, HCT, MCV, PLT  Lab Results  Component Value Date   CREATININE 0.73 (L) 04/29/2017    No results found for: PSA  No results found for: TESTOSTERONE  No results found for: HGBA1C  Urinalysis    Component Value Date/Time   APPEARANCEUR Clear 05/20/2017 0936   GLUCOSEU Negative 05/20/2017 0936   BILIRUBINUR Negative 05/20/2017 0936   PROTEINUR Negative 05/20/2017 0936   NITRITE Negative 05/20/2017 0936   LEUKOCYTESUR Negative 05/20/2017 0936    Pertinent Imaging: none  Assessment & Plan: Reassess in 5 weeks on Myrbetriq 50 mg samples and prescription  There are no diagnoses linked to this encounter.  No follow-ups on file.  Reece Packer, MD  [facility] [address], [place], [place] [address] [phone]

## 2017-08-26 ENCOUNTER — Encounter: Payer: Self-pay | Admitting: Urology

## 2017-08-26 ENCOUNTER — Ambulatory Visit (INDEPENDENT_AMBULATORY_CARE_PROVIDER_SITE_OTHER): Payer: Medicare Other | Admitting: Urology

## 2017-08-26 VITALS — BP 150/66 | HR 82 | Ht 69.0 in | Wt 116.0 lb

## 2017-08-26 DIAGNOSIS — N3946 Mixed incontinence: Secondary | ICD-10-CM | POA: Diagnosis not present

## 2017-08-26 MED ORDER — MIRABEGRON ER 50 MG PO TB24: 50.0000 mg | ORAL_TABLET | Freq: Every day | ORAL | 11 refills | Status: DC

## 2017-08-26 MED ORDER — MIRABEGRON ER 50 MG PO TB24: 50.0000 mg | ORAL_TABLET | Freq: Every day | ORAL | 11 refills | Status: AC

## 2017-08-26 NOTE — Progress Notes (Signed)
08/26/2017 10:48 AM   Thomas Beasley Aug 16, 1940 [id number]  Referring provider: Tracie Harrier, MD [address] [facility] [place], [place] [address]  Chief Complaint  Patient presents with  . Urinary Frequency    1 month    HPI: The patient was referred for blood in the urine and blood in the sperm. For at least 6 months he has had blood with ejaculation that comes and goes. Sometimes he may have some small clots or coagulation associated with it. Sometimes it is brown in color. He does not complain of a lot of pain with ejaculation.  He takes daily baby aspirin but no other blood thinners and he has never smoked  He voids every 1 hour and gets up 5-6 times at night. He wears a pad during the day for near daily urgency incontinence. He changes tissue a few times throughout the day with a double pad system. He voids with a good flow.  CT scan and cystoscopy within normal limits other than 1-2 mm stones in both kidneys nonobstructing.  Patient was cleared for blood in sperm and urine. I thought it was best to give him a course of antibiotics for prostatitis recognizing potential limited value. Theoretically this might even help some of his urgency. Difficulty in treating blood in sperm discussed. Reevaluate in approximately 5 or 6 weeks.Ciprofloxacin 500 mg twice a day for 28 days given. Alpha blockers the beta 3 agonist and other OAB agents would be prudent to consider.  Blood in sperm no longer present.  Urgency and frequency still present.  Clinically not infected. Reassess in 5 weeks on Myrbetriq 50 mg samples and prescription  Today The patient now only gets up once a night.  He is going less frequently during the day.  He is staying dry.  He has had some chronic changes in bowel movements but he still quite regular and takes MiraLAX for such.  I do not think is the cause of his elevated blood pressure today.  It was elevated last time off  medication.  Clinically not infected      PMH: Past Medical History:  Diagnosis Date  . Abnormal weight loss   . Cancer ([place])    SKIN  . Change in bowel habits   . Chronic kidney disease   . Constipation   . Dysphagia   . Kidney stone   . Paresthesia of both feet   . Situational disturbance     Surgical History: Past Surgical History:  Procedure Laterality Date  . COLONOSCOPY    . COLONOSCOPY WITH PROPOFOL N/A 07/16/2016   Procedure: COLONOSCOPY WITH PROPOFOL;  Surgeon: Manya Silvas, MD;  Location: [facility] ENDOSCOPY;  Service: Endoscopy;  Laterality: N/A;  . ESOPHAGOGASTRODUODENOSCOPY (EGD) WITH PROPOFOL N/A 07/16/2016   Procedure: ESOPHAGOGASTRODUODENOSCOPY (EGD) WITH PROPOFOL;  Surgeon: Manya Silvas, MD;  Location: [facility] ENDOSCOPY;  Service: Endoscopy;  Laterality: N/A;  . SKIN CANCER EXCISED    . TONSILLECTOMY      Home Medications:  Allergies as of 08/26/2017   No Known Allergies     Medication List        Accurate as of 08/26/17 10:48 AM. Always use your most recent med list.          aspirin EC 325 MG tablet Take 325 mg by mouth daily.   cholecalciferol 1000 units tablet Commonly known as:  VITAMIN D Take 1,000 Units by mouth daily.   ciprofloxacin 500 MG tablet Commonly known as:  CIPRO Take 1 tablet (500 mg total) by mouth every 12 (twelve) hours.   ciprofloxacin 500 MG tablet Commonly known as:  CIPRO Take 1 tablet (500 mg total) by mouth 2 (two) times daily.   docusate sodium 100 MG capsule Commonly known as:  COLACE Take 2 capsules (200 mg total) by mouth 2 (two) times daily.   mirabegron ER 50 MG Tb24 tablet Commonly known as:  MYRBETRIQ Take 1 tablet (50 mg total) by mouth daily.   polyethylene glycol powder powder Commonly known as:  GLYCOLAX/MIRALAX 2 cap fulls in a full glass of water, two times a day, for 5 days.   senna 8.6 MG Tabs tablet Commonly known as:  SENOKOT Take 2 tablets (17.2 mg total) by mouth 2 (two) times daily.     vitamin A 7500 UNIT capsule Take 7,500 Units by mouth daily.   vitamin B-12 500 MCG tablet Commonly known as:  CYANOCOBALAMIN Take by mouth daily.   vitamin C 100 MG tablet Take 100 mg by mouth daily.       Allergies: No Known Allergies  Family History: Family History  Problem Relation Age of Onset  . Kidney cancer Father   . Kidney disease Neg Hx   . Prostate cancer Neg Hx     Social History:  reports that he has never smoked. He has never used smokeless tobacco. He reports that he drinks alcohol. He reports that he does not use drugs.  ROS:                                        Physical Exam: There were no vitals taken for this visit.  Constitutional:  Alert and oriented, No acute distress.   Laboratory Data: No results found for: WBC, HGB, HCT, MCV, PLT  Lab Results  Component Value Date   CREATININE 0.73 (L) 04/29/2017    No results found for: PSA  No results found for: TESTOSTERONE  No results found for: HGBA1C  Urinalysis    Component Value Date/Time   APPEARANCEUR Clear 07/15/2017 1344   GLUCOSEU Negative 07/15/2017 1344   BILIRUBINUR Negative 07/15/2017 1344   PROTEINUR Negative 07/15/2017 1344   NITRITE Negative 07/15/2017 1344   LEUKOCYTESUR Negative 07/15/2017 1344    Pertinent Imaging:   Assessment & Plan: The patient went through the package label every side effect.  He tends to be quite an anxious man.  I asked he felt he was doing very well and did not change the medication.  I will see him back in about 3 months.  There are no diagnoses linked to this encounter.  No follow-ups on file.  Reece Packer, MD  [facility] [address], [place], [place] [address] [phone]

## 2017-12-02 ENCOUNTER — Ambulatory Visit: Payer: Medicare Other

## 2019-08-05 IMAGING — CT CT ABD-PEL WO/W CM
2 of 12 series · 9 of 46 positions shown, 15 images · IV contrast (APPLIED)
Comparison: 05/10/2016 CT abdomen/pelvis.

CLINICAL DATA: Microhematuria. Hematospermia.

EXAM:
CT ABDOMEN AND PELVIS WITHOUT AND WITH CONTRAST
TECHNIQUE: Multidetector CT imaging of the abdomen and pelvis was performed
following the standard protocol before and following the bolus
administration of intravenous contrast.
CONTRAST:  100mL [id number] IOPAMIDOL ([id number]) INJECTION 61%

[Series 2: axial pre · axial · non-contrast · 0.70mm/px · z∈[-890,-550]mm · 7 of 92 slices shown, 12 images]
[im 12/92  soft-tissue]
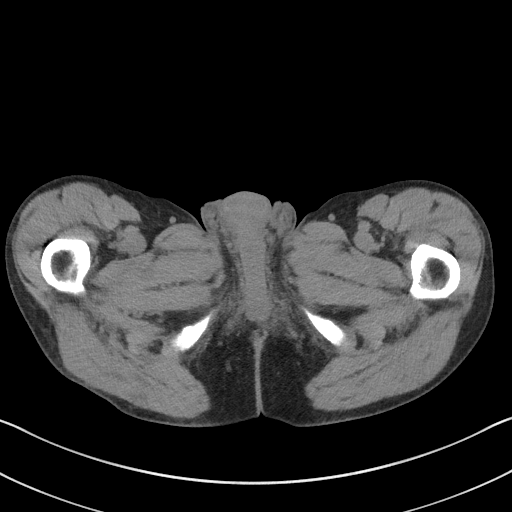
[im 12/92  bone]
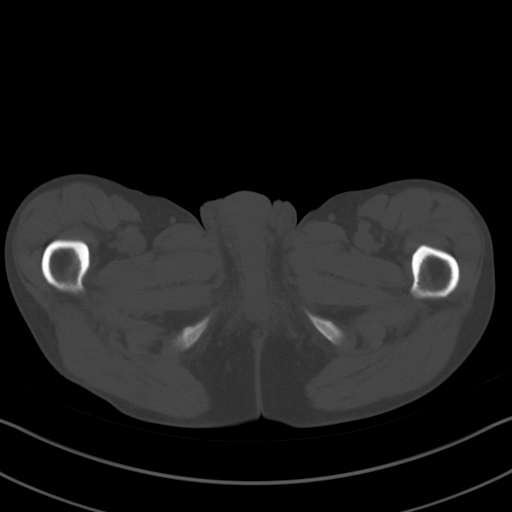
[im 23/92  soft-tissue]
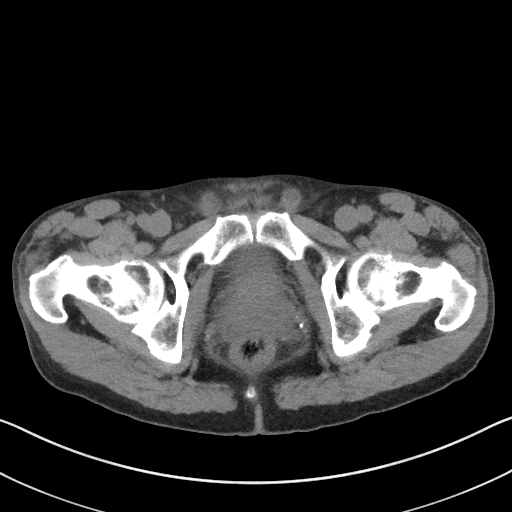
[im 35/92  soft-tissue]
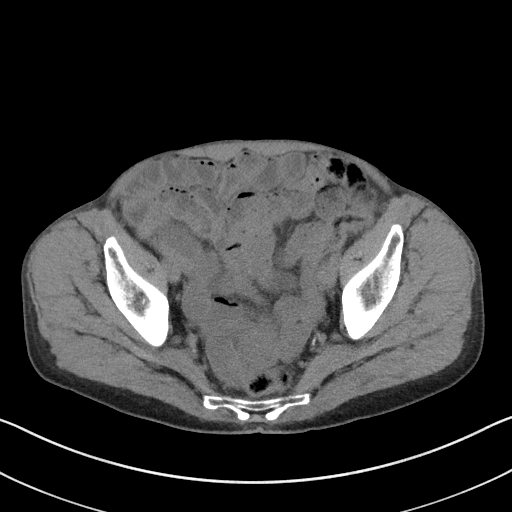
[im 46/92  soft-tissue]
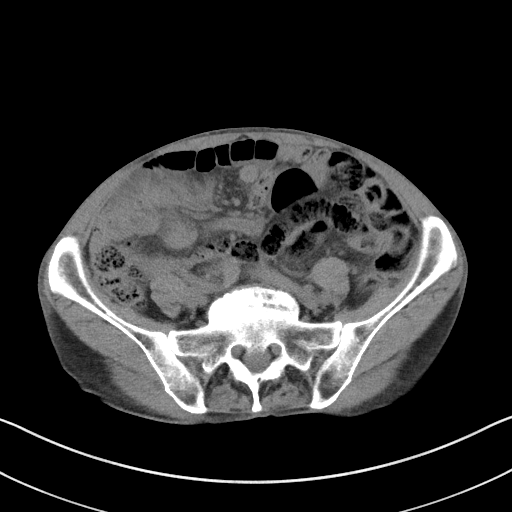
[im 46/92  lung]
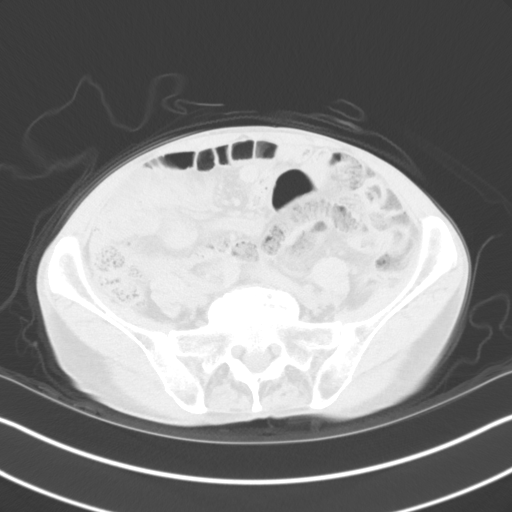
[im 57/92  soft-tissue]
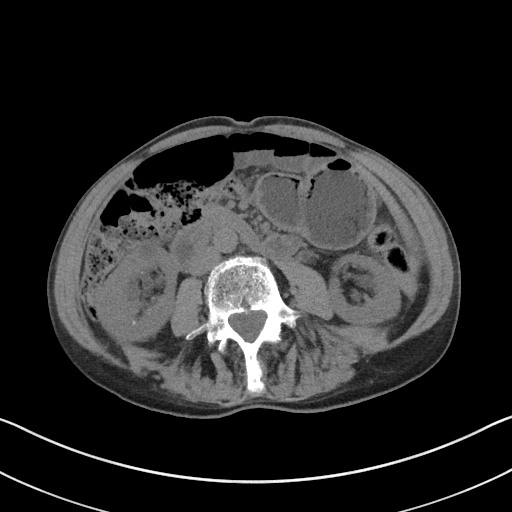
[im 57/92  lung]
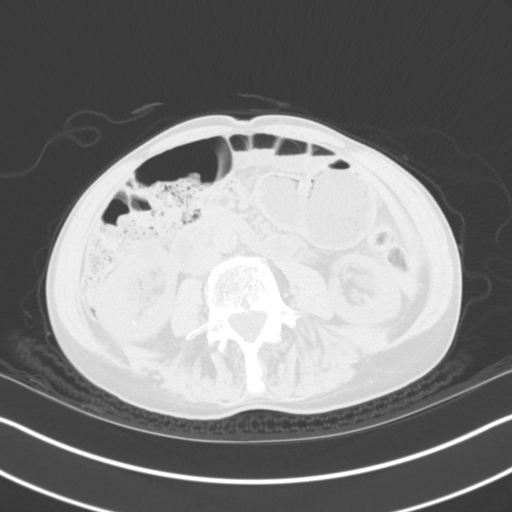
[im 69/92  soft-tissue]
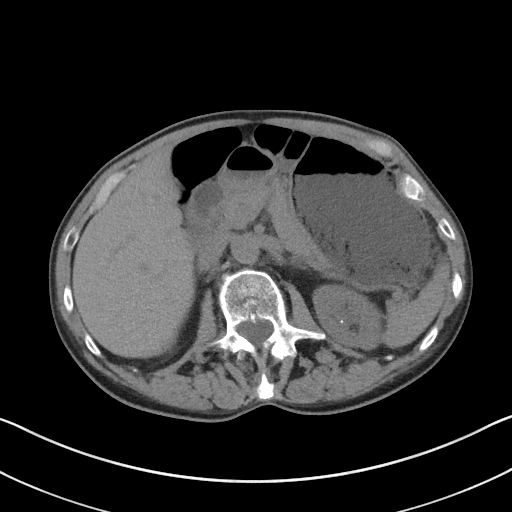
[im 69/92  lung]
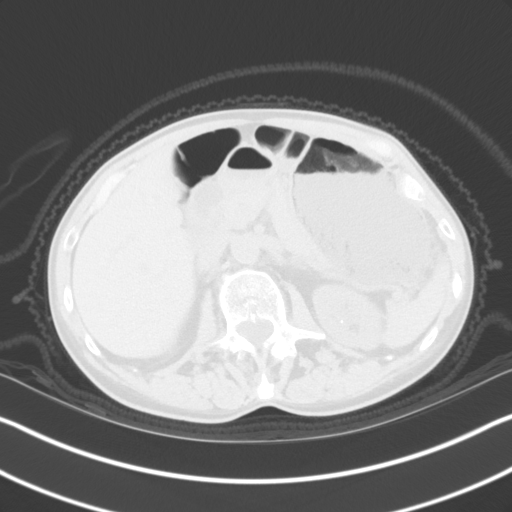
[im 80/92  soft-tissue]
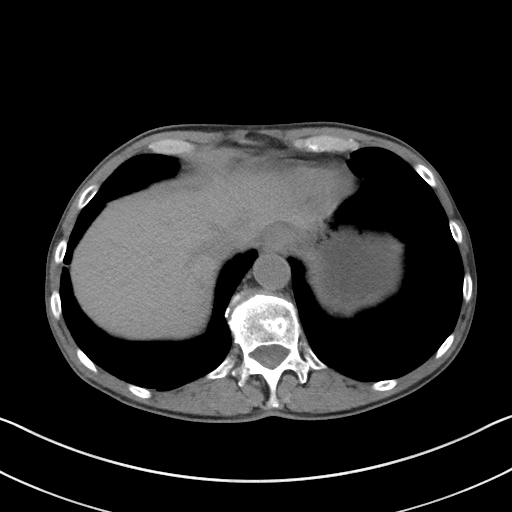
[im 80/92  lung]
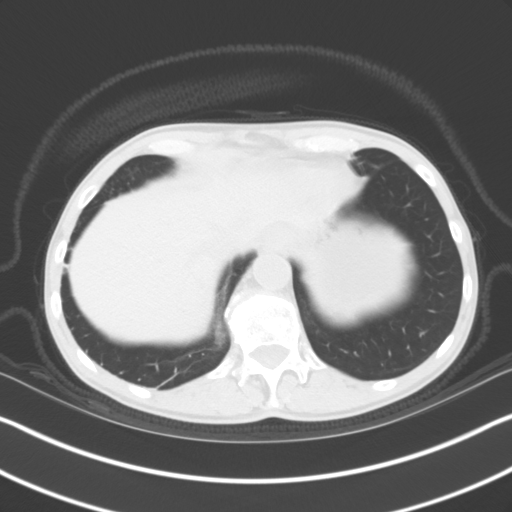

[Series 6: coronal pre · coronal · non-contrast · 0.67mm/px · 2 of 73 slices shown, 3 images]
[im 25/73  soft-tissue]
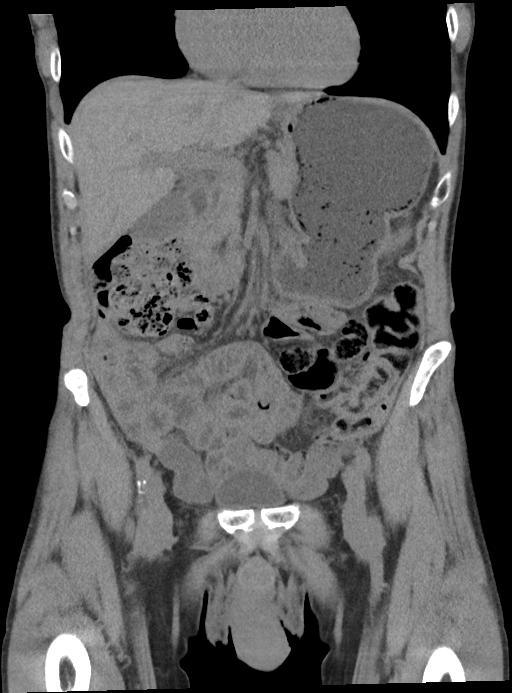
[im 25/73  bone]
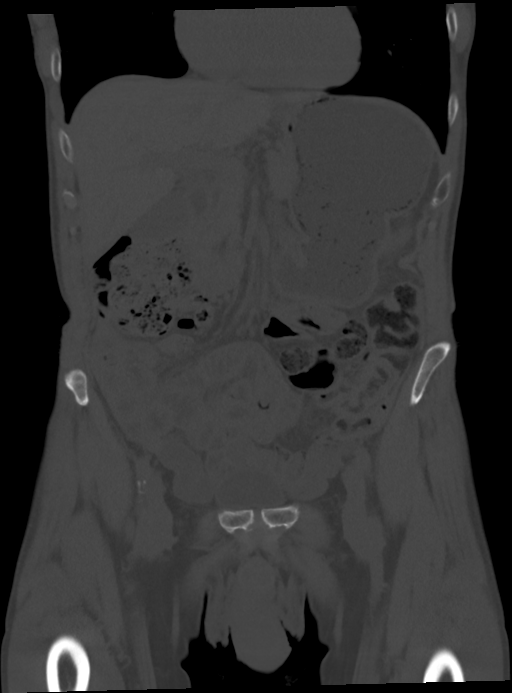
[im 49/73  soft-tissue]
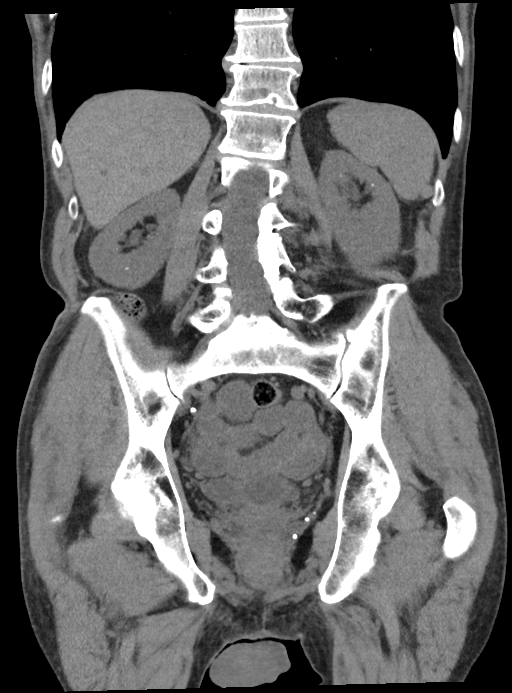

[9 of 46 positions shown; findings below may reference images not displayed]

FINDINGS: Lower chest: Left lower lobe 3 mm solid pulmonary nodule (series
15/image 6) is stable since 05/10/2016, considered benign. No acute
abnormality at the lung bases.

Hepatobiliary: Normal liver size. Simple 1.1 cm left liver dome
cyst. A few additional scattered subcentimeter hypodense liver
lesions are too small to characterize and not appreciably changed,
probably benign. No convincing new liver lesions. Normal gallbladder
with no radiopaque cholelithiasis. No biliary ductal dilatation.

Pancreas: Hypodense 0.9 cm pancreatic body lesion superiorly (series
4/image 20), stable in size since 05/10/2016, too small to
accurately characterize. No additional pancreatic lesions. No
pancreatic duct dilation.

Spleen: Normal size. No mass.

Adrenals/Urinary Tract: No discrete adrenal nodules. There are
several (at least 5) scattered punctate 1-2 mm nonobstructing stones
in the right kidney. A few (at least 4) scattered punctate
nonobstructing 1 mm stones in the upper left kidney. Fullness of the
bilateral renal collecting systems without overt hydronephrosis.
Normal caliber ureters, with no ureteral stones. Simple 1.2 cm
anterior lower right renal cyst. Additional subcentimeter hypodense
renal cortical lesions in both kidneys are too small to characterize
and require no follow-up. A few small simple parapelvic renal cysts
are present in both kidneys. On delayed imaging, there is no
urothelial wall thickening and there are no filling defects in the
opacified portions of the bilateral collecting systems or ureters,
noting limited evaluation of the proximal left lumbar ureter due to
limited opacification. No bladder stones. Mild diffuse bladder wall
thickening and trabeculation with several small bilateral bladder
wall diverticula, largest 1.3 cm in the lateral left bladder wall.
No bladder mass.

Stomach/Bowel: Normal non-distended stomach. Normal caliber small
bowel with no small bowel wall thickening. Appendix not discretely
visualized. No pericecal inflammatory changes. Normal large bowel
with no diverticulosis, large bowel wall thickening or pericolonic
fat stranding.

Vascular/Lymphatic: Atherosclerotic nonaneurysmal abdominal aorta.
Patent portal, splenic, hepatic and renal veins. No pathologically
enlarged lymph nodes in the abdomen or pelvis.

Reproductive: Mildly enlarged prostate. Prostate dimensions 4.0 x
3.8 x 4.6 cm (volume = 37 cm^3).

Other: No pneumoperitoneum, ascites or focal fluid collection.

Musculoskeletal: No aggressive appearing focal osseous lesions.
Marked lumbar spondylosis.
IMPRESSION: 1. Punctate nonobstructing stones in both kidneys. Mild fullness of
the renal collecting systems without overt hydronephrosis. No
ureteral or bladder stones.
2. No suspicious renal cortical masses. No evidence of urothelial
lesions.
3. Nonspecific mild diffuse bladder wall thickening and
trabeculation with several small bladder wall diverticula. Findings
may be due to chronic bladder outlet obstruction by the mildly
enlarged prostate.
4. Low-attenuation 0.9 cm pancreatic body lesion, for which 1 year
stability has been demonstrated. Further characterization advised
with MRI abdomen without and with IV contrast, which can safely be
performed in 1 year given the stability.
5.  Aortic Atherosclerosis ([id number].X).
